# Patient Record
Sex: Male | Born: 1970 | Race: Black or African American | Hispanic: No | Marital: Single | Smoking: Current every day smoker
Health system: Southern US, Community
[De-identification: ages and names within clinical notes are randomized; demographics above are authoritative.]

## PROBLEM LIST (undated history)

## (undated) DIAGNOSIS — K5090 Crohn's disease, unspecified, without complications: Secondary | ICD-10-CM

## (undated) DIAGNOSIS — M1990 Unspecified osteoarthritis, unspecified site: Secondary | ICD-10-CM

## (undated) DIAGNOSIS — E785 Hyperlipidemia, unspecified: Secondary | ICD-10-CM

---

## 2012-05-09 ENCOUNTER — Emergency Department (HOSPITAL_BASED_OUTPATIENT_CLINIC_OR_DEPARTMENT_OTHER)
Admission: EM | Admit: 2012-05-09 | Discharge: 2012-05-09 | Disposition: A | Discharge status: Home / Self Care | Payer: Self-pay | Attending: Emergency Medicine | Admitting: Emergency Medicine

## 2012-05-09 ENCOUNTER — Encounter (HOSPITAL_BASED_OUTPATIENT_CLINIC_OR_DEPARTMENT_OTHER): Payer: Self-pay | Admitting: *Deleted

## 2012-05-09 DIAGNOSIS — F17200 Nicotine dependence, unspecified, uncomplicated: Secondary | ICD-10-CM | POA: Insufficient documentation

## 2012-05-09 DIAGNOSIS — IMO0002 Reserved for concepts with insufficient information to code with codable children: Secondary | ICD-10-CM | POA: Insufficient documentation

## 2012-05-09 DIAGNOSIS — M129 Arthropathy, unspecified: Secondary | ICD-10-CM | POA: Insufficient documentation

## 2012-05-09 DIAGNOSIS — Y998 Other external cause status: Secondary | ICD-10-CM | POA: Insufficient documentation

## 2012-05-09 DIAGNOSIS — K5090 Crohn's disease, unspecified, without complications: Secondary | ICD-10-CM | POA: Insufficient documentation

## 2012-05-09 DIAGNOSIS — S46919A Strain of unspecified muscle, fascia and tendon at shoulder and upper arm level, unspecified arm, initial encounter: Secondary | ICD-10-CM

## 2012-05-09 DIAGNOSIS — S139XXA Sprain of joints and ligaments of unspecified parts of neck, initial encounter: Secondary | ICD-10-CM | POA: Insufficient documentation

## 2012-05-09 DIAGNOSIS — S161XXA Strain of muscle, fascia and tendon at neck level, initial encounter: Secondary | ICD-10-CM

## 2012-05-09 DIAGNOSIS — M5430 Sciatica, unspecified side: Secondary | ICD-10-CM | POA: Insufficient documentation

## 2012-05-09 DIAGNOSIS — Y93I9 Activity, other involving external motion: Secondary | ICD-10-CM | POA: Insufficient documentation

## 2012-05-09 HISTORY — DX: Crohn's disease, unspecified, without complications: K50.90

## 2012-05-09 HISTORY — DX: Unspecified osteoarthritis, unspecified site: M19.90

## 2012-05-09 MED ORDER — CYCLOBENZAPRINE HCL 10 MG PO TABS: 10.0000 mg | ORAL_TABLET | Freq: Three times a day (TID) | ORAL | Status: AC | PRN

## 2012-05-09 MED ORDER — NAPROXEN 500 MG PO TABS: 500.0000 mg | ORAL_TABLET | Freq: Two times a day (BID) | ORAL | Status: AC

## 2012-05-09 MED ORDER — IBUPROFEN 800 MG PO TABS
800.0000 mg | ORAL_TABLET | Freq: Once | ORAL | Status: AC
  Administered 2012-05-09: 800 mg via ORAL
  Filled 2012-05-09: qty 1

## 2012-05-09 MED ORDER — HYDROCODONE-ACETAMINOPHEN 5-500 MG PO TABS: 1.0000 | ORAL_TABLET | Freq: Four times a day (QID) | ORAL | Status: AC | PRN

## 2012-05-09 NOTE — Discharge Instructions (Signed)
Cervical Strain Care After A cervical strain is when the muscles and ligaments in your neck have been stretched. The bones are not broken. If you had any problems moving your arms or legs immediately after the injury, even if the problem has gone away, make sure to tell this to your caregiver.  HOME CARE INSTRUCTIONS   While awake, apply ice packs to the neck or areas of pain about every 1 to 2 hours, for 15 to 20 minutes at a time. Do this for 2 days. If you were given a cervical collar for support, ask your caregiver if you may remove it for bathing or applying ice.   If given a cervical collar, wear as instructed. Do not remove any collar unless instructed by a caregiver.   Only take over-the-counter or prescription medicines for pain, discomfort, or fever as directed by your caregiver.  Recheck with the hospital or clinic after a radiologist has read your X-rays. Recheck with the hospital or clinic to make sure the initial readings are correct. Do this also to determine if you need further studies. It is your responsibility to find out your X-ray results. X-rays are sometimes repeated in one week to ten days. These are often repeated to make sure that a hairline fracture was not overlooked. Ask your caregiver how you are to find out about your radiology (X-ray) results. SEEK IMMEDIATE MEDICAL CARE IF:   You have increasing pain in your neck.   You develop difficulties swallowing or breathing.   You have numbness, weakness, or movement problems in the arms or legs.   You have difficulty walking.   You develop bowel or bladder retention or incontinence.   You have problems with walking.  MAKE SURE YOU:   Understand these instructions.   Will watch your condition.   Will get help right away if you are not doing well or get worse.  Document Released: 11/20/2005 Document Revised: 08/02/2011 Document Reviewed: 07/03/2008 [facility] Patient Information 2012 [place],  [place].  Sciatica Sciatica is a weakness and/or changes in sensation (tingling, jolts, hot and cold, numbness) along the path the sciatic nerve travels. Irritation or damage to lumbar nerve roots is often also referred to as lumbar radiculopathy.  Lumbar radiculopathy (Sciatica) is the most common form of this problem. Radiculopathy can occur in any of the nerves coming out of the spinal cord. The problems caused depend on which nerves are involved. The sciatic nerve is the large nerve supplying the branches of nerves going from the hip to the toes. It often causes a numbness or weakness in the skin and/or muscles that the sciatic nerve serves. It also may cause symptoms (problems) of pain, burning, tingling, or electric shock-like feelings in the path of this nerve. This usually comes from injury to the fibers that make up the sciatic nerve. Some of these symptoms are low back pain and/or unpleasant feelings in the following areas:  From the mid-buttock down the back of the leg to the back of the knee.   And/or the outside of the calf and top of the foot.   And/or behind the inner ankle to the sole of the foot.  CAUSES   Herniated or slipped disc. Discs are the little cushions between the bones in the back.   Pressure by the piriformis muscle in the buttock on the sciatic nerve (Piriformis Syndrome).   Misalignment of the bones in the lower back and buttocks (Sacroiliac Joint Derangement).   Narrowing of the spinal canal  that puts pressure on or pinches the fibers that make up the sciatic nerve.   A slipped vertebra that is out of line with those above or beneath it.   Abnormality of the nervous system itself so that nerve fibers do not transmit signals properly, especially to feet and calves (neuropathy).   Tumor (this is rare).  Your caregiver can usually determine the cause of your sciatica and begin the treatment most likely to help you. TREATMENT  Taking over-the-counter painkillers,  physical therapy, rest, exercise, spinal manipulation, and injections of anesthetics and/or steroids may be used. Surgery, acupuncture, and Yoga can also be effective. Mind over matter techniques, mental imagery, and changing factors such as your bed, chair, desk height, posture, and activities are other treatments that may be helpful. You and your caregiver can help determine what is best for you. With proper diagnosis, the cause of most sciatica can be identified and removed. Communication and cooperation between your caregiver and you is essential. If you are not successful immediately, do not be discouraged. With time, a proper treatment can be found that will make you comfortable. HOME CARE INSTRUCTIONS   If the pain is coming from a problem in the back, applying ice to that area for 15 to 20 minutes, 3 to 4 times per day while awake, may be helpful. Put the ice in a plastic bag. Place a towel between the bag of ice and your skin.   You may exercise or perform your usual activities if these do not aggravate your pain, or as suggested by your caregiver.   Only take over-the-counter or prescription medicines for pain, discomfort, or fever as directed by your caregiver.   If your caregiver has given you a follow-up appointment, it is very important to keep that appointment. Not keeping the appointment could result in a chronic or permanent injury, pain, and disability. If there is any problem keeping the appointment, you must call back to this facility for assistance.  SEEK IMMEDIATE MEDICAL CARE IF:   You experience loss of control of bowel or bladder.   You have increasing weakness in the trunk, buttocks, or legs.   There is numbness in any areas from the hip down to the toes.   You have difficulty walking or keeping your balance.   You have any of the above, with fever or forceful vomiting.  Document Released: 11/14/2001 Document Revised: 11/09/2011 Document Reviewed: 07/03/2008 [facility]  Patient Information 2012 [place], [place].
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
## 2012-12-04 HISTORY — PX: FOOT SURGERY: SHX648

## 2015-05-02 ENCOUNTER — Encounter (HOSPITAL_COMMUNITY): Payer: Self-pay | Admitting: Emergency Medicine

## 2015-05-02 ENCOUNTER — Emergency Department (HOSPITAL_COMMUNITY): Payer: Managed Care, Other (non HMO)

## 2015-05-02 ENCOUNTER — Emergency Department (HOSPITAL_COMMUNITY)
Admission: EM | Admit: 2015-05-02 | Discharge: 2015-05-02 | Disposition: A | Discharge status: Home / Self Care | Payer: Managed Care, Other (non HMO) | Attending: Emergency Medicine | Admitting: Emergency Medicine

## 2015-05-02 DIAGNOSIS — M1990 Unspecified osteoarthritis, unspecified site: Secondary | ICD-10-CM | POA: Diagnosis not present

## 2015-05-02 DIAGNOSIS — R55 Syncope and collapse: Secondary | ICD-10-CM | POA: Diagnosis present

## 2015-05-02 DIAGNOSIS — Y92410 Unspecified street and highway as the place of occurrence of the external cause: Secondary | ICD-10-CM | POA: Insufficient documentation

## 2015-05-02 DIAGNOSIS — Y939 Activity, unspecified: Secondary | ICD-10-CM | POA: Diagnosis not present

## 2015-05-02 DIAGNOSIS — X58XXXA Exposure to other specified factors, initial encounter: Secondary | ICD-10-CM | POA: Insufficient documentation

## 2015-05-02 DIAGNOSIS — Y999 Unspecified external cause status: Secondary | ICD-10-CM | POA: Diagnosis not present

## 2015-05-02 DIAGNOSIS — S0993XA Unspecified injury of face, initial encounter: Secondary | ICD-10-CM | POA: Diagnosis not present

## 2015-05-02 DIAGNOSIS — Z720 Tobacco use: Secondary | ICD-10-CM | POA: Diagnosis not present

## 2015-05-02 DIAGNOSIS — R110 Nausea: Secondary | ICD-10-CM | POA: Insufficient documentation

## 2015-05-02 DIAGNOSIS — S0990XA Unspecified injury of head, initial encounter: Secondary | ICD-10-CM | POA: Insufficient documentation

## 2015-05-02 DIAGNOSIS — S0240CB Maxillary fracture, right side, initial encounter for open fracture: Secondary | ICD-10-CM

## 2015-05-02 DIAGNOSIS — S0181XA Laceration without foreign body of other part of head, initial encounter: Secondary | ICD-10-CM | POA: Insufficient documentation

## 2015-05-02 DIAGNOSIS — S02401B Maxillary fracture, unspecified, initial encounter for open fracture: Secondary | ICD-10-CM | POA: Diagnosis not present

## 2015-05-02 DIAGNOSIS — Z23 Encounter for immunization: Secondary | ICD-10-CM | POA: Diagnosis not present

## 2015-05-02 DIAGNOSIS — Z8719 Personal history of other diseases of the digestive system: Secondary | ICD-10-CM | POA: Diagnosis not present

## 2015-05-02 LAB — CBC WITH DIFFERENTIAL/PLATELET
Basophils Absolute: 0 10*3/uL (ref 0.0–0.1)
Basophils Relative: 0 % (ref 0–1)
Eosinophils Absolute: 0.1 10*3/uL (ref 0.0–0.7)
Eosinophils Relative: 1 % (ref 0–5)
HEMATOCRIT: 42.7 % (ref 39.0–52.0)
HEMOGLOBIN: 14.6 g/dL (ref 13.0–17.0)
LYMPHS ABS: 2.8 10*3/uL (ref 0.7–4.0)
Lymphocytes Relative: 51 % — ABNORMAL HIGH (ref 12–46)
MCH: 29.7 pg (ref 26.0–34.0)
MCHC: 34.2 g/dL (ref 30.0–36.0)
MCV: 86.8 fL (ref 78.0–100.0)
Monocytes Absolute: 0.3 10*3/uL (ref 0.1–1.0)
Monocytes Relative: 6 % (ref 3–12)
Neutro Abs: 2.4 10*3/uL (ref 1.7–7.7)
Neutrophils Relative %: 42 % — ABNORMAL LOW (ref 43–77)
PLATELETS: 154 10*3/uL (ref 150–400)
RBC: 4.92 MIL/uL (ref 4.22–5.81)
RDW: 13.2 % (ref 11.5–15.5)
WBC: 5.6 10*3/uL (ref 4.0–10.5)

## 2015-05-02 LAB — COMPREHENSIVE METABOLIC PANEL
ALBUMIN: 4.1 g/dL (ref 3.5–5.0)
ALT: 43 U/L (ref 17–63)
ANION GAP: 9 (ref 5–15)
AST: 58 U/L — AB (ref 15–41)
Alkaline Phosphatase: 74 U/L (ref 38–126)
BILIRUBIN TOTAL: 1.4 mg/dL — AB (ref 0.3–1.2)
BUN: 12 mg/dL (ref 6–20)
CHLORIDE: 103 mmol/L (ref 101–111)
CO2: 24 mmol/L (ref 22–32)
CREATININE: 1.17 mg/dL (ref 0.61–1.24)
Calcium: 8.7 mg/dL — ABNORMAL LOW (ref 8.9–10.3)
Glucose, Bld: 159 mg/dL — ABNORMAL HIGH (ref 65–99)
POTASSIUM: 5.4 mmol/L — AB (ref 3.5–5.1)
SODIUM: 136 mmol/L (ref 135–145)
TOTAL PROTEIN: 6.7 g/dL (ref 6.5–8.1)

## 2015-05-02 LAB — CBG MONITORING, ED: Glucose-Capillary: 162 mg/dL — ABNORMAL HIGH (ref 65–99)

## 2015-05-02 MED ORDER — CLINDAMYCIN HCL 300 MG PO CAPS
300.0000 mg | ORAL_CAPSULE | Freq: Once | ORAL | Status: AC
  Administered 2015-05-02: 300 mg via ORAL
  Filled 2015-05-02: qty 1

## 2015-05-02 MED ORDER — FENTANYL CITRATE (PF) 100 MCG/2ML IJ SOLN
50.0000 ug | Freq: Once | INTRAMUSCULAR | Status: AC
  Administered 2015-05-02: 50 ug via INTRAVENOUS
  Filled 2015-05-02: qty 2

## 2015-05-02 MED ORDER — FENTANYL CITRATE (PF) 100 MCG/2ML IJ SOLN
INTRAMUSCULAR | Status: AC
  Filled 2015-05-02: qty 2

## 2015-05-02 MED ORDER — OXYCODONE-ACETAMINOPHEN 5-325 MG PO TABS: 1.0000 | ORAL_TABLET | ORAL | Status: AC | PRN

## 2015-05-02 MED ORDER — FENTANYL CITRATE (PF) 100 MCG/2ML IJ SOLN
50.0000 ug | Freq: Once | INTRAMUSCULAR | Status: AC
  Administered 2015-05-02: 50 ug via INTRAVENOUS

## 2015-05-02 MED ORDER — TETANUS-DIPHTH-ACELL PERTUSSIS 5-2.5-18.5 LF-MCG/0.5 IM SUSP
0.5000 mL | Freq: Once | INTRAMUSCULAR | Status: AC
  Administered 2015-05-02: 0.5 mL via INTRAMUSCULAR
  Filled 2015-05-02: qty 0.5

## 2015-05-02 MED ORDER — CLINDAMYCIN HCL 300 MG PO CAPS: 300.0000 mg | ORAL_CAPSULE | Freq: Four times a day (QID) | ORAL | Status: AC

## 2015-05-02 MED ORDER — LIDOCAINE-EPINEPHRINE (PF) 2 %-1:200000 IJ SOLN
10.0000 mL | Freq: Once | INTRAMUSCULAR | Status: AC
  Administered 2015-05-02: 10 mL
  Filled 2015-05-02: qty 20

## 2015-05-02 NOTE — ED Notes (Signed)
Patient transported to CT 

## 2015-05-02 NOTE — ED Provider Notes (Signed)
  LACERATION REPAIR Performed by: Evynn Boutelle Authorized by: Oswaldo ConroyREECH, Labaron Digirolamo Consent: Verbal consent obtained. Risks and benefits: risks, benefits and alternatives were discussed Consent given by: patient Patient identity confirmed: provided demographic data Prepped and Draped in normal sterile fashion Wound explored  Laceration Location: right lower chin  Laceration Length: 2-3cm  No Foreign Bodies seen or palpated  Anesthesia: local infiltration  Local anesthetic: lidocaine 2% with epinephrine  Anesthetic total: 5 ml  Irrigation method: syringe Amount of cleaning: extensive  Skin closure: 5-0 Vicryl Rapid for mucosal closure. 5-0 Proline for cutaneous closure  Number of sutures: 2 vicryl 4 proline  Technique: simple interrupted  Patient tolerance: Patient tolerated the procedure well with no immediate complications.   Oswaldo ConroyVictoria Gauri Galvao, PA-C 05/02/15 [phone]  Loren Raceravid Yelverton, MD 05/03/15 [phone]
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
## 2015-05-02 NOTE — ED Notes (Signed)
Tori, PA at the bedside.  

## 2015-05-02 NOTE — Discharge Instructions (Signed)
Your sutures need to be removed in 7 days. Call and make an appointment to follow-up with the oral surgeon regarding your dental and facial fracture. Take antibiotics as prescribed. Return immediately for any evidence of infection including redness, swelling, warmth or fever.  Dental Fracture You have a dental fracture or injury. This can mean the tooth is loose, has a chip in the enamel or is broken. If just the outer enamel is chipped, there is a good chance the tooth will not become infected. The only treatment needed may be to smooth off a rough edge. Fractures into the deeper layers (dentin and pulp) cause greater pain and are more likely to become infected. These require you to see a dentist as soon as possible to save the tooth. Loose teeth may need to be wired or bonded with a plastic splint to hold them in place. A paste may be painted on the open area of the broken tooth to reduce the pain. Antibiotics and pain medicine may be prescribed. Choosing a soft or liquid diet and rinsing the mouth out with warm water after meals may be helpful. See your dentist as recommended. Failure to seek care or follow up with a dentist or other specialist as recommended could result in the loss of your tooth, infection, or permanent dental problems. SEEK MEDICAL CARE IF:   You have increased pain not controlled with medicines.  You have swelling around the tooth, in the face or neck.  You have bleeding which starts, continues, or gets worse.  You have a fever. Document Released: 12/28/2004 Document Revised: 02/12/2012 Document Reviewed: 10/12/2009 [facility] Patient Information 2015 [place], [place]. This information is not intended to replace advice given to you by your health care provider. Make sure you discuss any questions you have with your health care provider.  Facial Fracture A facial fracture is a break in one of the bones of your face. HOME CARE INSTRUCTIONS   Protect the injured part of your  face until it is healed.  Do not participate in activities which give chance for re-injury until your doctor approves.  Gently wash and dry your face.  Wear head and facial protection while riding a bicycle, motorcycle, or snowmobile. SEEK MEDICAL CARE IF:   An oral temperature above 102 F (38.9 C) develops.  You have severe headaches or notice changes in your vision.  You have new numbness or tingling in your face.  You develop nausea (feeling sick to your stomach), vomiting or a stiff neck. SEEK IMMEDIATE MEDICAL CARE IF:   You develop difficulty seeing or experience double vision.  You become dizzy, lightheaded, or faint.  You develop trouble speaking, breathing, or swallowing.  You have a watery discharge from your nose or ear. MAKE SURE YOU:   Understand these instructions.  Will watch your condition.  Will get help right away if you are not doing well or get worse. Document Released: 11/20/2005 Document Revised: 02/12/2012 Document Reviewed: 07/09/2008 [facility] Patient Information 2015 [place], [place]. This information is not intended to replace advice given to you by your health care provider. Make sure you discuss any questions you have with your health care provider.  Head Injury You have received a head injury. It does not appear serious at this time. Headaches and vomiting are common following head injury. It should be easy to awaken from sleeping. Sometimes it is necessary for you to stay in the emergency department for a while for observation. Sometimes admission to the hospital may be  needed. After injuries such as yours, most problems occur within the first 24 hours, but side effects may occur up to 7-10 days after the injury. It is important for you to carefully monitor your condition and contact your health care provider or seek immediate medical care if there is a change in your condition. WHAT ARE THE TYPES OF HEAD INJURIES? Head injuries can be as minor as  a bump. Some head injuries can be more severe. More severe head injuries include:  A jarring injury to the brain (concussion).  A bruise of the brain (contusion). This mean there is bleeding in the brain that can cause swelling.  A cracked skull (skull fracture).  Bleeding in the brain that collects, clots, and forms a bump (hematoma). WHAT CAUSES A HEAD INJURY? A serious head injury is most likely to happen to someone who is in a car wreck and is not wearing a seat belt. Other causes of major head injuries include bicycle or motorcycle accidents, sports injuries, and falls. HOW ARE HEAD INJURIES DIAGNOSED? A complete history of the event leading to the injury and your current symptoms will be helpful in diagnosing head injuries. Many times, pictures of the brain, such as CT or MRI are needed to see the extent of the injury. Often, an overnight hospital stay is necessary for observation.  WHEN SHOULD I SEEK IMMEDIATE MEDICAL CARE?  You should get help right away if:  You have confusion or drowsiness.  You feel sick to your stomach (nauseous) or have continued, forceful vomiting.  You have dizziness or unsteadiness that is getting worse.  You have severe, continued headaches not relieved by medicine. Only take over-the-counter or prescription medicines for pain, fever, or discomfort as directed by your health care provider.  You do not have normal function of the arms or legs or are unable to walk.  You notice changes in the black spots in the center of the colored part of your eye (pupil).  You have a clear or bloody fluid coming from your nose or ears.  You have a loss of vision. During the next 24 hours after the injury, you must stay with someone who can watch you for the warning signs. This person should contact local emergency services (911 in the [place] if you have seizures, you become unconscious, or you are unable to wake up. HOW CAN I PREVENT A HEAD INJURY IN THE FUTURE? The  most important factor for preventing major head injuries is avoiding motor vehicle accidents. To minimize the potential for damage to your head, it is crucial to wear seat belts while riding in motor vehicles. Wearing helmets while bike riding and playing collision sports (like football) is also helpful. Also, avoiding dangerous activities around the house will further help reduce your risk of head injury.  WHEN CAN I RETURN TO NORMAL ACTIVITIES AND ATHLETICS? You should be reevaluated by your health care provider before returning to these activities. If you have any of the following symptoms, you should not return to activities or contact sports until 1 week after the symptoms have stopped:  Persistent headache.  Dizziness or vertigo.  Poor attention and concentration.  Confusion.  Memory problems.  Nausea or vomiting.  Fatigue or tire easily.  Irritability.  Intolerant of bright lights or loud noises.  Anxiety or depression.  Disturbed sleep. MAKE SURE YOU:   Understand these instructions.  Will watch your condition.  Will get help right away if you are not doing well or get  worse. Document Released: 11/20/2005 Document Revised: 11/25/2013 Document Reviewed: 07/28/2013 [facility] Patient Information 2015 [place], [place]. This information is not intended to replace advice given to you by your health care provider. Make sure you discuss any questions you have with your health care provider.  Laceration Care, Adult A laceration is a cut or lesion that goes through all layers of the skin and into the tissue just beneath the skin. TREATMENT  Some lacerations may not require closure. Some lacerations may not be able to be closed due to an increased risk of infection. It is important to see your caregiver as soon as possible after an injury to minimize the risk of infection and maximize the opportunity for successful closure. If closure is appropriate, pain medicines may be given, if  needed. The wound will be cleaned to help prevent infection. Your caregiver will use stitches (sutures), staples, wound glue (adhesive), or skin adhesive strips to repair the laceration. These tools bring the skin edges together to allow for faster healing and a better cosmetic outcome. However, all wounds will heal with a scar. Once the wound has healed, scarring can be minimized by covering the wound with sunscreen during the day for 1 full year. HOME CARE INSTRUCTIONS  For sutures or staples:  Keep the wound clean and dry.  If you were given a bandage (dressing), you should change it at least once a day. Also, change the dressing if it becomes wet or dirty, or as directed by your caregiver.  Wash the wound with soap and water 2 times a day. Rinse the wound off with water to remove all soap. Pat the wound dry with a clean towel.  After cleaning, apply a thin layer of the antibiotic ointment as recommended by your caregiver. This will help prevent infection and keep the dressing from sticking.  You may shower as usual after the first 24 hours. Do not soak the wound in water until the sutures are removed.  Only take over-the-counter or prescription medicines for pain, discomfort, or fever as directed by your caregiver.  Get your sutures or staples removed as directed by your caregiver. For skin adhesive strips:  Keep the wound clean and dry.  Do not get the skin adhesive strips wet. You may bathe carefully, using caution to keep the wound dry.  If the wound gets wet, pat it dry with a clean towel.  Skin adhesive strips will fall off on their own. You may trim the strips as the wound heals. Do not remove skin adhesive strips that are still stuck to the wound. They will fall off in time. For wound adhesive:  You may briefly wet your wound in the shower or bath. Do not soak or scrub the wound. Do not swim. Avoid periods of heavy perspiration until the skin adhesive has fallen off on its own.  After showering or bathing, gently pat the wound dry with a clean towel.  Do not apply liquid medicine, cream medicine, or ointment medicine to your wound while the skin adhesive is in place. This may loosen the film before your wound is healed.  If a dressing is placed over the wound, be careful not to apply tape directly over the skin adhesive. This may cause the adhesive to be pulled off before the wound is healed.  Avoid prolonged exposure to sunlight or tanning lamps while the skin adhesive is in place. Exposure to ultraviolet light in the first year will darken the scar.  The skin adhesive will  usually remain in place for 5 to 10 days, then naturally fall off the skin. Do not pick at the adhesive film. You may need a tetanus shot if:  You cannot remember when you had your last tetanus shot.  You have never had a tetanus shot. If you get a tetanus shot, your arm may swell, get red, and feel warm to the touch. This is common and not a problem. If you need a tetanus shot and you choose not to have one, there is a rare chance of getting tetanus. Sickness from tetanus can be serious. SEEK MEDICAL CARE IF:   You have redness, swelling, or increasing pain in the wound.  You see a red line that goes away from the wound.  You have yellowish-white fluid (pus) coming from the wound.  You have a fever.  You notice a bad smell coming from the wound or dressing.  Your wound breaks open before or after sutures have been removed.  You notice something coming out of the wound such as wood or glass.  Your wound is on your hand or foot and you cannot move a finger or toe. SEEK IMMEDIATE MEDICAL CARE IF:   Your pain is not controlled with prescribed medicine.  You have severe swelling around the wound causing pain and numbness or a change in color in your arm, hand, leg, or foot.  Your wound splits open and starts bleeding.  You have worsening numbness, weakness, or loss of function of any  joint around or beyond the wound.  You develop painful lumps near the wound or on the skin anywhere on your body. MAKE SURE YOU:   Understand these instructions.  Will watch your condition.  Will get help right away if you are not doing well or get worse. Document Released: 11/20/2005 Document Revised: 02/12/2012 Document Reviewed: 05/16/2011 [facility] Patient Information 2015 [place], [place]. This information is not intended to replace advice given to you by your health care provider. Make sure you discuss any questions you have with your health care provider.  Syncope Syncope is a medical term for fainting or passing out. This means you lose consciousness and drop to the ground. People are generally unconscious for less than 5 minutes. You may have some muscle twitches for up to 15 seconds before waking up and returning to normal. Syncope occurs more often in older adults, but it can happen to anyone. While most causes of syncope are not dangerous, syncope can be a sign of a serious medical problem. It is important to seek medical care.  CAUSES  Syncope is caused by a sudden drop in blood flow to the brain. The specific cause is often not determined. Factors that can bring on syncope include:  Taking medicines that lower blood pressure.  Sudden changes in posture, such as standing up quickly.  Taking more medicine than prescribed.  Standing in one place for too long.  Seizure disorders.  Dehydration and excessive exposure to heat.  Low blood sugar (hypoglycemia).  Straining to have a bowel movement.  Heart disease, irregular heartbeat, or other circulatory problems.  Fear, emotional distress, seeing blood, or severe pain. SYMPTOMS  Right before fainting, you may:  Feel dizzy or light-headed.  Feel nauseous.  See all white or all black in your field of vision.  Have cold, clammy skin. DIAGNOSIS  Your health care provider will ask about your symptoms, perform a physical  exam, and perform an electrocardiogram (ECG) to record the electrical activity of your  heart. Your health care provider may also perform other heart or blood tests to determine the cause of your syncope which may include:  Transthoracic echocardiogram (TTE). During echocardiography, sound waves are used to evaluate how blood flows through your heart.  Transesophageal echocardiogram (TEE).  Cardiac monitoring. This allows your health care provider to monitor your heart rate and rhythm in real time.  Holter monitor. This is a portable device that records your heartbeat and can help diagnose heart arrhythmias. It allows your health care provider to track your heart activity for several days, if needed.  Stress tests by exercise or by giving medicine that makes the heart beat faster. TREATMENT  In most cases, no treatment is needed. Depending on the cause of your syncope, your health care provider may recommend changing or stopping some of your medicines. HOME CARE INSTRUCTIONS  Have someone stay with you until you feel stable.  Do not drive, use machinery, or play sports until your health care provider says it is okay.  Keep all follow-up appointments as directed by your health care provider.  Lie down right away if you start feeling like you might faint. Breathe deeply and steadily. Wait until all the symptoms have passed.  Drink enough fluids to keep your urine clear or pale yellow.  If you are taking blood pressure or heart medicine, get up slowly and take several minutes to sit and then stand. This can reduce dizziness. SEEK IMMEDIATE MEDICAL CARE IF:   You have a severe headache.  You have unusual pain in the chest, abdomen, or back.  You are bleeding from your mouth or rectum, or you have black or tarry stool.  You have an irregular or very fast heartbeat.  You have pain with breathing.  You have repeated fainting or seizure-like jerking during an episode.  You faint when  sitting or lying down.  You have confusion.  You have trouble walking.  You have severe weakness.  You have vision problems. If you fainted, call your local emergency services (911 in [place] Do not drive yourself to the hospital.  MAKE SURE YOU:  Understand these instructions.  Will watch your condition.  Will get help right away if you are not doing well or get worse. Document Released: 11/20/2005 Document Revised: 11/25/2013 Document Reviewed: 01/19/2012 [facility] Patient Information 2015 [place]. This information is not intended to replace advice given to you by your health care provider. Make sure you discuss any questions you have with your health care provider.
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
## 2015-05-11 ENCOUNTER — Emergency Department (HOSPITAL_BASED_OUTPATIENT_CLINIC_OR_DEPARTMENT_OTHER)
Admission: EM | Admit: 2015-05-11 | Discharge: 2015-05-11 | Disposition: A | Discharge status: Home / Self Care | Payer: Managed Care, Other (non HMO) | Attending: Emergency Medicine | Admitting: Emergency Medicine

## 2015-05-11 ENCOUNTER — Encounter (HOSPITAL_BASED_OUTPATIENT_CLINIC_OR_DEPARTMENT_OTHER): Payer: Self-pay | Admitting: Emergency Medicine

## 2015-05-11 DIAGNOSIS — Z792 Long term (current) use of antibiotics: Secondary | ICD-10-CM | POA: Diagnosis not present

## 2015-05-11 DIAGNOSIS — Z8719 Personal history of other diseases of the digestive system: Secondary | ICD-10-CM | POA: Insufficient documentation

## 2015-05-11 DIAGNOSIS — Z8739 Personal history of other diseases of the musculoskeletal system and connective tissue: Secondary | ICD-10-CM | POA: Diagnosis not present

## 2015-05-11 DIAGNOSIS — Z720 Tobacco use: Secondary | ICD-10-CM | POA: Diagnosis not present

## 2015-05-11 DIAGNOSIS — Z4802 Encounter for removal of sutures: Secondary | ICD-10-CM

## 2015-05-11 NOTE — ED Provider Notes (Signed)
CSN: [id number]     Arrival date & time 05/11/15  0128 History   First MD Initiated Contact with Patient 05/11/15 0145     Chief Complaint  Patient presents with  . Suture / Staple Removal     (Consider location/radiation/quality/duration/timing/severity/associated sxs/prior Treatment) Patient is a 44 y.o. male presenting with suture removal. The history is provided by the patient.  Suture / Staple Removal This is a new problem. The current episode started more than 1 week ago. The problem occurs constantly. The problem has not changed since onset.Pertinent negatives include no chest pain, no abdominal pain, no headaches and no shortness of breath. Nothing aggravates the symptoms. He has tried nothing for the symptoms. The treatment provided significant relief.  Stitched placed in chin no drainage.  Healing well.  No f/c/r  Past Medical History  Diagnosis Date  . Arthritis   . Crohn's disease    Past Surgical History  Procedure Laterality Date  . Foot surgery Bilateral 2014   History reviewed. No pertinent family history. History  Substance Use Topics  . Smoking status: Current Every Day Smoker -- 0.50 packs/day  . Smokeless tobacco: Not on file  . Alcohol Use: No     Comment: one drink last ngith    Review of Systems  Respiratory: Negative for shortness of breath.   Cardiovascular: Negative for chest pain.  Gastrointestinal: Negative for abdominal pain.  Neurological: Negative for headaches.  All other systems reviewed and are negative.     Allergies  Aspirin  Home Medications   Prior to Admission medications   Medication Sig Start Date End Date Taking? Authorizing Provider  clindamycin (CLEOCIN) 300 MG capsule Take 1 capsule (300 mg total) by mouth 4 (four) times daily. X 7 days 05/02/15   Loren Raceravid Yelverton, MD  naproxen sodium (ANAPROX) 220 MG tablet Take 220 mg by mouth daily as needed (pain). For pain    Historical Provider, MD  oxyCODONE-acetaminophen (PERCOCET)  5-325 MG per tablet Take 1-2 tablets by mouth every 4 (four) hours as needed for severe pain. 05/02/15   Loren Raceravid Yelverton, MD   BP 128/73 mmHg  Pulse 97  Temp(Src) 98.4 F (36.9 C) (Oral)  Resp 16  Ht 6\' 4"  (1.93 m)  Wt 232 lb (105.235 kg)  BMI 28.25 kg/m2  SpO2 96% Physical Exam  Constitutional: He is oriented to person, place, and time. He appears well-developed and well-nourished. No distress.  HENT:  Head: Normocephalic.    Mouth/Throat: Oropharynx is clear and moist.  Eyes: Conjunctivae are normal. Pupils are equal, round, and reactive to light.  Neck: Normal range of motion. Neck supple.  Cardiovascular: Normal rate, regular rhythm and normal heart sounds.   Pulmonary/Chest: Effort normal and breath sounds normal. He has no wheezes. He has no rales.  Abdominal: Soft. Bowel sounds are normal. There is no tenderness.  Musculoskeletal: Normal range of motion.  Neurological: He is alert and oriented to person, place, and time.  Skin: Skin is warm and dry.  Psychiatric: He has a normal mood and affect.    ED Course  Procedures (including critical care time) Labs Review Labs Reviewed - No data to display  Imaging Review No results found.   EKG Interpretation None      MDM   Final diagnoses:  Visit for suture removal    Aftercare instructions given.  Hat in sun x6 months and spf 50   Raunak Antuna, MD 05/11/15 [id number]

## 2015-05-11 NOTE — ED Notes (Signed)
Patient has 3 stiches to his lower lip

## 2015-05-11 NOTE — ED Notes (Signed)
Patient had 4 stiches to his chin, stiches removed.

## 2015-05-11 NOTE — Discharge Instructions (Signed)

## 2018-09-24 ENCOUNTER — Encounter (HOSPITAL_BASED_OUTPATIENT_CLINIC_OR_DEPARTMENT_OTHER): Payer: Self-pay | Admitting: *Deleted

## 2018-09-24 ENCOUNTER — Other Ambulatory Visit: Payer: Self-pay

## 2018-09-24 ENCOUNTER — Emergency Department (HOSPITAL_BASED_OUTPATIENT_CLINIC_OR_DEPARTMENT_OTHER)
Admission: EM | Admit: 2018-09-24 | Discharge: 2018-09-24 | Disposition: A | Discharge status: Home / Self Care | Payer: 59 | Attending: Emergency Medicine | Admitting: Emergency Medicine

## 2018-09-24 ENCOUNTER — Emergency Department (HOSPITAL_BASED_OUTPATIENT_CLINIC_OR_DEPARTMENT_OTHER): Payer: 59

## 2018-09-24 DIAGNOSIS — Z79899 Other long term (current) drug therapy: Secondary | ICD-10-CM | POA: Insufficient documentation

## 2018-09-24 DIAGNOSIS — Y9389 Activity, other specified: Secondary | ICD-10-CM | POA: Insufficient documentation

## 2018-09-24 DIAGNOSIS — R55 Syncope and collapse: Secondary | ICD-10-CM | POA: Insufficient documentation

## 2018-09-24 DIAGNOSIS — Y999 Unspecified external cause status: Secondary | ICD-10-CM | POA: Insufficient documentation

## 2018-09-24 DIAGNOSIS — Y92410 Unspecified street and highway as the place of occurrence of the external cause: Secondary | ICD-10-CM | POA: Insufficient documentation

## 2018-09-24 DIAGNOSIS — M542 Cervicalgia: Secondary | ICD-10-CM | POA: Diagnosis present

## 2018-09-24 MED ORDER — ACETAMINOPHEN 500 MG PO TABS
1000.0000 mg | ORAL_TABLET | Freq: Once | ORAL | Status: AC
  Administered 2018-09-24: 1000 mg via ORAL
  Filled 2018-09-24: qty 2

## 2018-09-24 NOTE — ED Notes (Signed)
Went in to discharge pt  Pt not in room
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
## 2018-09-24 NOTE — ED Notes (Addendum)
Documentation on wrong pt. corrected
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
## 2022-04-25 ENCOUNTER — Emergency Department (HOSPITAL_BASED_OUTPATIENT_CLINIC_OR_DEPARTMENT_OTHER)
Admission: EM | Admit: 2022-04-25 | Discharge: 2022-04-25 | Disposition: A | Discharge status: Home / Self Care | Payer: 59 | Attending: Emergency Medicine | Admitting: Emergency Medicine

## 2022-04-25 ENCOUNTER — Other Ambulatory Visit: Payer: Self-pay

## 2022-04-25 ENCOUNTER — Encounter (HOSPITAL_BASED_OUTPATIENT_CLINIC_OR_DEPARTMENT_OTHER): Payer: Self-pay | Admitting: Emergency Medicine

## 2022-04-25 ENCOUNTER — Emergency Department (HOSPITAL_BASED_OUTPATIENT_CLINIC_OR_DEPARTMENT_OTHER): Payer: 59

## 2022-04-25 DIAGNOSIS — R072 Precordial pain: Secondary | ICD-10-CM | POA: Diagnosis present

## 2022-04-25 HISTORY — DX: Hyperlipidemia, unspecified: E78.5

## 2022-04-25 LAB — BASIC METABOLIC PANEL
Anion gap: 5 (ref 5–15)
BUN: 17 mg/dL (ref 6–20)
CO2: 25 mmol/L (ref 22–32)
Calcium: 8.8 mg/dL — ABNORMAL LOW (ref 8.9–10.3)
Chloride: 107 mmol/L (ref 98–111)
Creatinine, Ser: 1.33 mg/dL — ABNORMAL HIGH (ref 0.61–1.24)
GFR, Estimated: >60 mL/min (ref >60)
Glucose, Bld: 145 mg/dL — ABNORMAL HIGH (ref 70–99)
Potassium: 3.8 mmol/L (ref 3.5–5.1)
Sodium: 137 mmol/L (ref 135–145)

## 2022-04-25 LAB — CBC
HCT: 38.7 % — ABNORMAL LOW (ref 39.0–52.0)
Hemoglobin: 13.4 g/dL (ref 13.0–17.0)
MCH: 29.6 pg (ref 26.0–34.0)
MCHC: 34.6 g/dL (ref 30.0–36.0)
MCV: 85.6 fL (ref 80.0–100.0)
Platelets: 64 10*3/uL — ABNORMAL LOW (ref 150–400)
RBC: 4.52 MIL/uL (ref 4.22–5.81)
RDW: 15.3 % (ref 11.5–15.5)
WBC: 3.8 10*3/uL — ABNORMAL LOW (ref 4.0–10.5)
nRBC: 0 % (ref 0.0–0.2)

## 2022-04-25 LAB — TROPONIN I (HIGH SENSITIVITY): Troponin I (High Sensitivity): 4 ng/L (ref <18)

## 2022-04-25 LAB — PROTIME-INR
INR: 1 (ref 0.8–1.2)
Prothrombin Time: 13 seconds (ref 11.4–15.2)

## 2022-04-25 NOTE — Discharge Instructions (Signed)
:   Make an appointment follow-up with cardiology.  Also follow-up with your GI doctor it is possible that Crohn's can affect the esophagus.  Could be responsible for the pain since Sunday.  No evidence of an acute cardiac event today.  But always could be a warning sign.  Platelets here today are in the 60,000 range so essentially unchanged.  Return for any new or worse symptoms.

## 2022-04-25 NOTE — ED Notes (Signed)
Pt verbalizes understanding of discharge instructions. Opportunity for questioning and answers were provided. Pt discharged from ED to home with family.    

## 2022-04-25 NOTE — ED Provider Notes (Signed)
[facility] EMERGENCY DEPARTMENT Provider Note   CSN: [id number] Arrival date & time: 04/25/22  2048     History  Chief Complaint  Patient presents with   Chest Pain    Hunter Mcmahon is a 51 y.o. male.  Patient with a complaint of a substernal chest pain waxes and wanes but never been gone since Sunday.  Also had a little bit of discomfort early on when he would drink liquids.  More in the upper part of the chest.  No shortness of breath no nausea vomiting.  Past medical history is significant for history of Crohn's.  Followed by GI medicine.  Has follow-up with them this week.   Also known to have hyperlipidemia.  And patient is an every day smoker half a pack a day.  The difficulty swallowing has resolved.  Never had any obstruction of food or water going down.      Home Medications Prior to Admission medications   Medication Sig Start Date End Date Taking? Authorizing Provider  clindamycin (CLEOCIN) 300 MG capsule Take 1 capsule (300 mg total) by mouth 4 (four) times daily. X 7 days 05/02/15   Loren Racer, MD  inFLIXimab (REMICADE IV) Inject into the vein.    [provider]  naproxen sodium (ANAPROX) 220 MG tablet Take 220 mg by mouth daily as needed (pain). For pain    [provider]  oxyCODONE-acetaminophen (PERCOCET) 5-325 MG per tablet Take 1-2 tablets by mouth every 4 (four) hours as needed for severe pain. 05/02/15   Loren Racer, MD      Allergies    Aspirin    Review of Systems   Review of Systems  Constitutional:  Negative for chills and fever.  HENT:  Negative for ear pain and sore throat.   Eyes:  Negative for pain and visual disturbance.  Respiratory:  Negative for cough and shortness of breath.   Cardiovascular:  Positive for chest pain. Negative for palpitations.  Gastrointestinal:  Negative for abdominal pain and vomiting.  Genitourinary:  Negative for dysuria and hematuria.  Musculoskeletal:  Negative for  arthralgias and back pain.  Skin:  Negative for color change and rash.  Neurological:  Negative for seizures and syncope.  All other systems reviewed and are negative.  Physical Exam Updated Vital Signs BP 112/66   Pulse 87   Temp 98.2 F (36.8 C) (Oral)   Resp 20   Ht 1.93 m (6\' 4" )   Wt 97.1 kg   SpO2 100%   BMI 26.05 kg/m  Physical Exam Vitals and nursing note reviewed.  Constitutional:      General: He is not in acute distress.    Appearance: Normal appearance. He is well-developed.  HENT:     Head: Normocephalic and atraumatic.  Eyes:     Extraocular Movements: Extraocular movements intact.     Conjunctiva/sclera: Conjunctivae normal.     Pupils: Pupils are equal, round, and reactive to light.  Cardiovascular:     Rate and Rhythm: Normal rate and regular rhythm.     Heart sounds: No murmur heard. Pulmonary:     Effort: Pulmonary effort is normal. No respiratory distress.     Breath sounds: Normal breath sounds.  Chest:     Chest wall: No tenderness.  Abdominal:     General: There is no distension.     Palpations: Abdomen is soft.     Tenderness: There is no abdominal tenderness. There is no guarding.  Musculoskeletal:  General: No swelling.     Cervical back: Normal range of motion and neck supple.  Skin:    General: Skin is warm and dry.     Capillary Refill: Capillary refill takes less than 2 seconds.  Neurological:     General: No focal deficit present.     Mental Status: He is alert and oriented to person, place, and time.     Cranial Nerves: No cranial nerve deficit.     Sensory: No sensory deficit.     Motor: No weakness.  Psychiatric:        Mood and Affect: Mood normal.    ED Results / Procedures / Treatments   Labs (all labs ordered are listed, but only abnormal results are displayed) Labs Reviewed  BASIC METABOLIC PANEL - Abnormal; Notable for the following components:      Result Value   Glucose, Bld 145 (*)    Creatinine, Ser 1.33  (*)    Calcium 8.8 (*)    All other components within normal limits  CBC - Abnormal; Notable for the following components:   WBC 3.8 (*)    HCT 38.7 (*)    Platelets 64 (*)    All other components within normal limits  PROTIME-INR  TROPONIN I (HIGH SENSITIVITY)  TROPONIN I (HIGH SENSITIVITY)    EKG EKG Interpretation  Date/Time:  Tuesday Apr 25 2022 20:57:04 EDT Ventricular Rate:  94 PR Interval:  156 QRS Duration: 84 QT Interval:  340 QTC Calculation: 425 R Axis:   52 Text Interpretation: Normal sinus rhythm with sinus arrhythmia Normal ECG When compared with ECG of 02-May-2015 02:48, PREVIOUS ECG IS PRESENT Confirmed by Vanetta MuldersZackowski, Beldon Nowling [phone]([id number]) on 04/25/2022 10:19:10 PM  Radiology DG Chest 2 View  Result Date: 04/25/2022 CLINICAL DATA:  Chest pain EXAM: CHEST - 2 VIEW COMPARISON:  06/24/2019 FINDINGS: Bullous changes at the apices. Lungs are otherwise clear. No pleural effusion or pneumothorax. The heart is normal in size. Visualized osseous structures are within normal limits. IMPRESSION: Normal chest radiographs. Electronically Signed   By: Charline BillsSriyesh  Krishnan M.D.   On: 04/25/2022 21:34    Procedures Procedures    Medications Ordered in ED Medications - No data to display  ED Course/ Medical Decision Making/ A&P                           Medical Decision Making Amount and/or Complexity of Data Reviewed Labs: ordered. Radiology: ordered.   Work-up for the chest pain which has been constant since Sunday.  Initial troponin of 4 very reassuring.  No worsening symptoms this evening do not feel that we need a delta troponin based on that.  Patient has a history of Crohn's disease basic metabolic panel without significant abnormalities creatinine is 1.33 but renal functions normal.  Blood sugar 145.  No leukocytosis white blood cell count 3.8.  Hemoglobin good at 13.4.  Patient known to have thrombocytopenia platelets are 64,000 which is baseline for him.  INR is normal.   Chest x-ray without any acute findings.  EKG without any acute findings.  Due to the duration of the pain I think patient stable for discharge home.  We will have him follow-up with cardiology.  Has follow-up with GI medicine this week.  Upper endoscopy may be indicated at some point if it persists since he does have a history of Crohn's disease.   Final Clinical Impression(s) / ED Diagnoses Final diagnoses:  Precordial pain  Rx / DC Orders ED Discharge Orders          Ordered    Ambulatory referral to Cardiology       Comments: If you have not heard from the Cardiology office within the next 72 hours please call [phone].   04/25/22 2256              Vanetta Mulders, MD 04/25/22 2300

## 2022-04-25 NOTE — ED Triage Notes (Signed)
Patient reports he has been having chest pain since Sunday, midsternal. Patient states when he drinks something he has increased pain. Reports some shortness of breath, denies n/v.

## 2022-06-07 ENCOUNTER — Encounter: Payer: Self-pay | Admitting: *Deleted

## 2022-07-14 IMAGING — DX DG CHEST 2V
2 series · 2 of 2 positions shown · non-contrast
Comparison: 06/24/2019

CLINICAL DATA: Chest pain

EXAM:
CHEST - 2 VIEW

[chest pa]
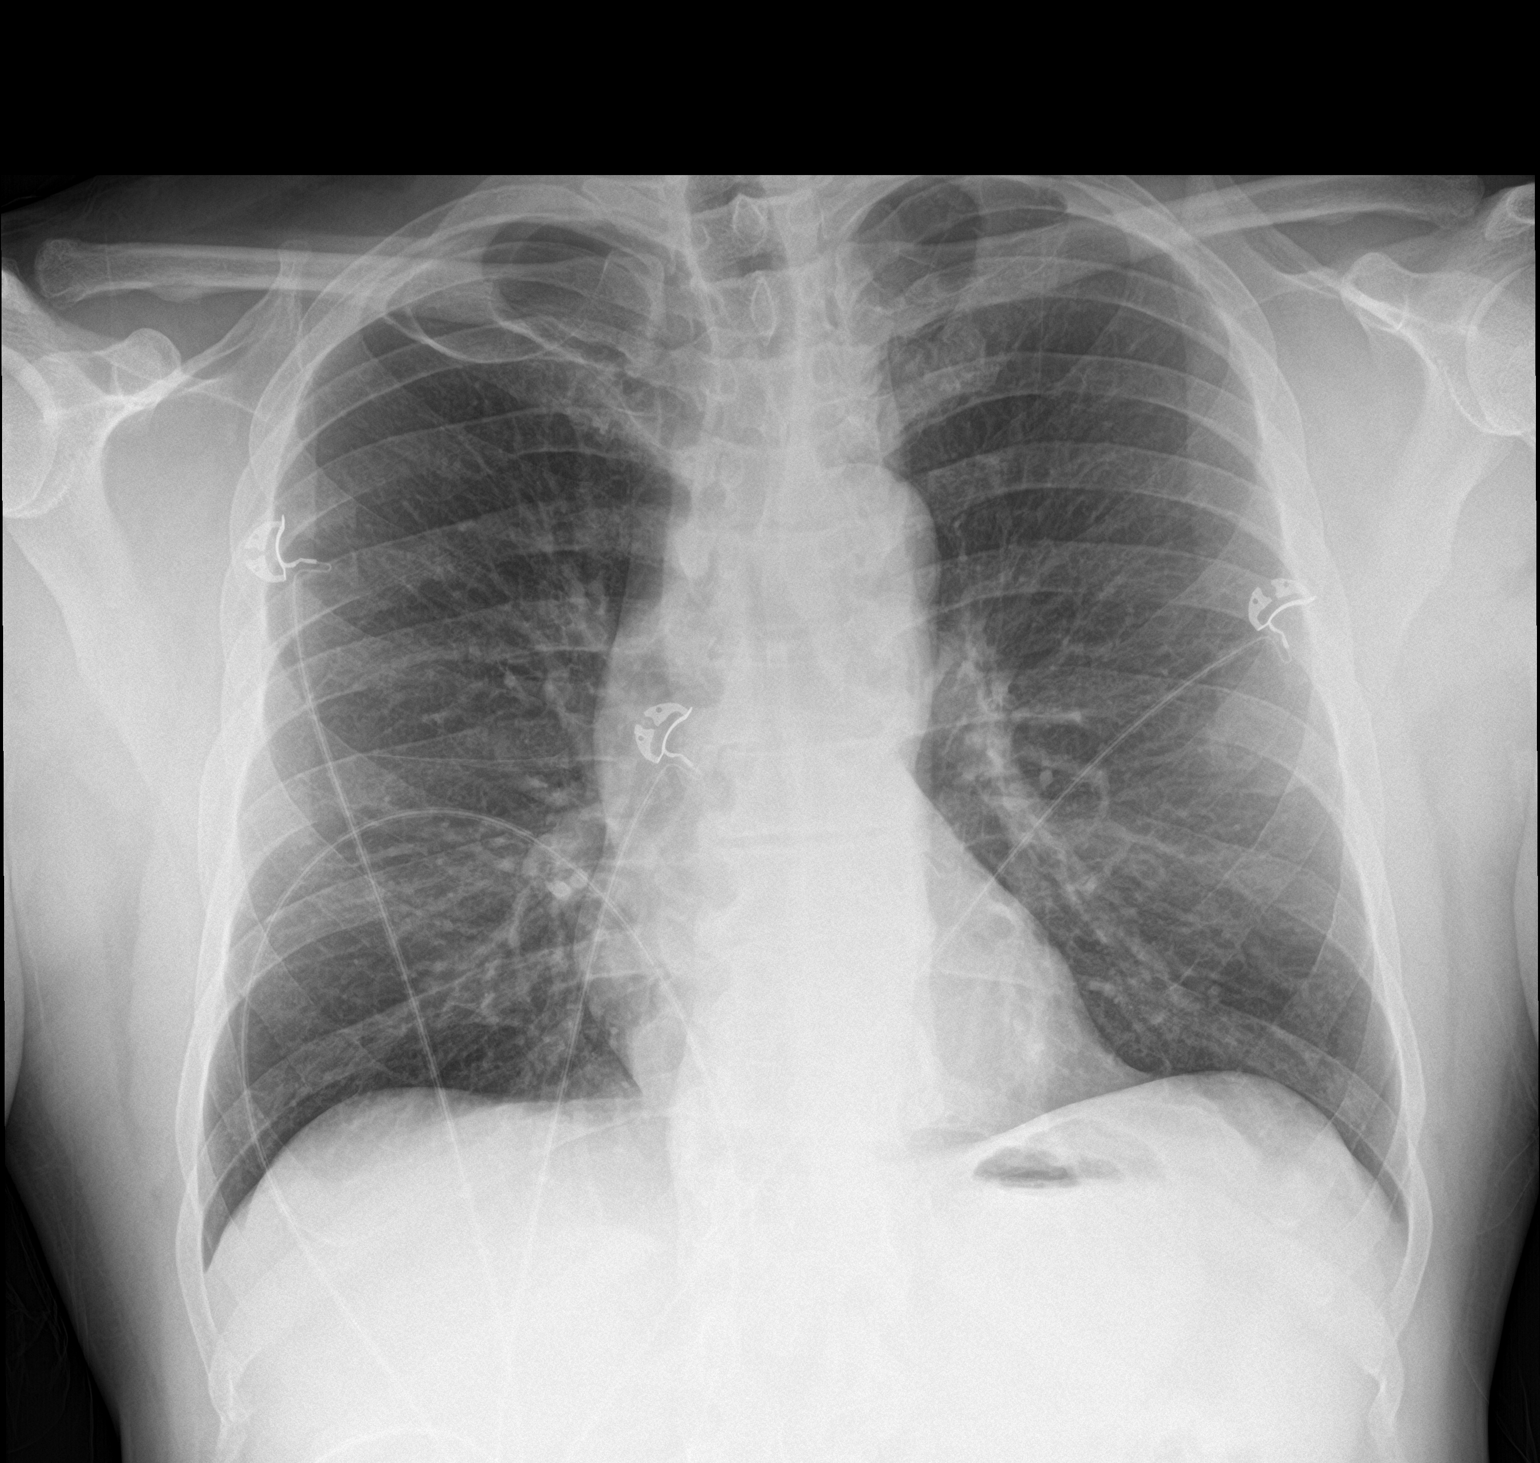

[chest lat]
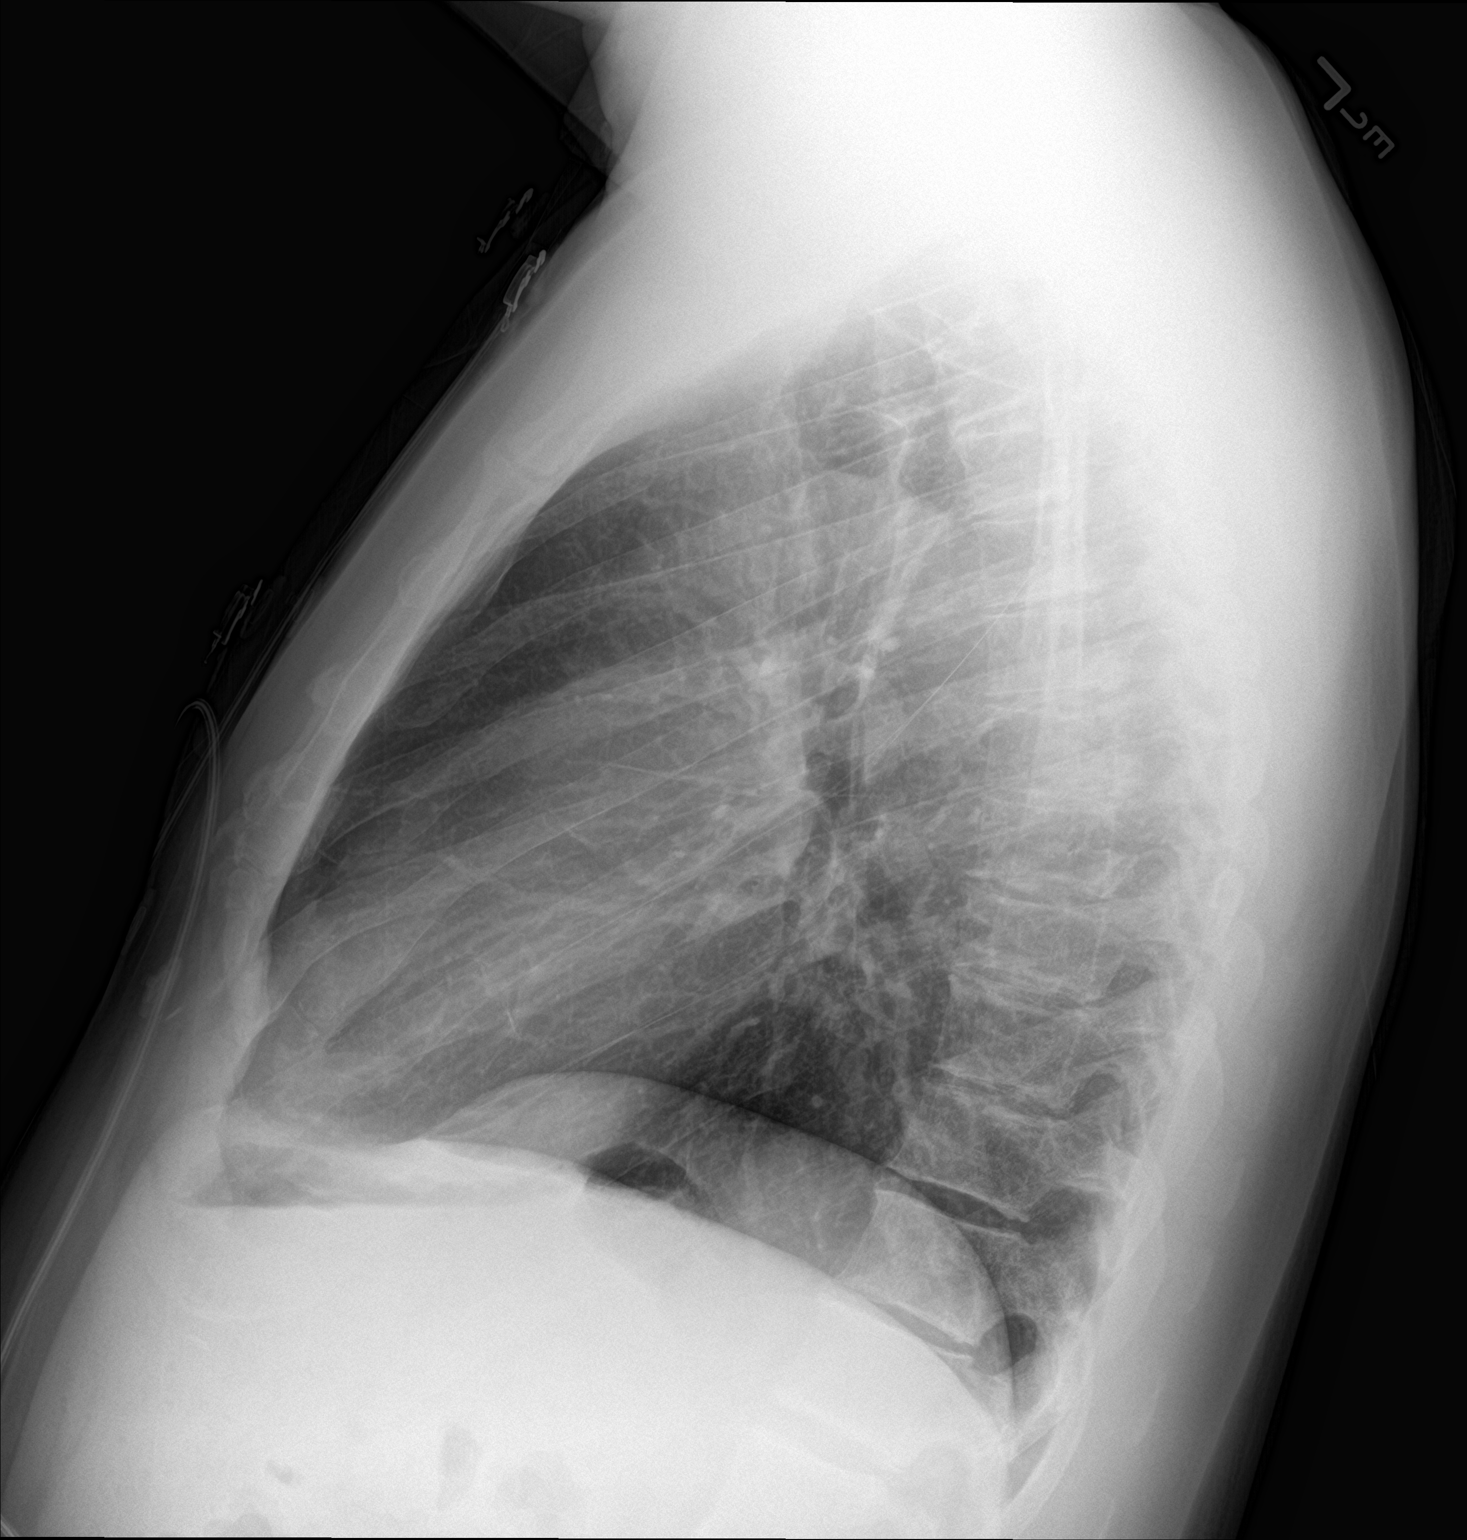

[2 of 2 positions shown; findings below may reference images not displayed]

FINDINGS: Bullous changes at the apices. Lungs are otherwise clear. No pleural
effusion or pneumothorax.

The heart is normal in size.

Visualized osseous structures are within normal limits.
IMPRESSION: Normal chest radiographs.

## 2025-01-01 ENCOUNTER — Other Ambulatory Visit: Payer: Self-pay

## 2025-01-01 ENCOUNTER — Emergency Department (HOSPITAL_BASED_OUTPATIENT_CLINIC_OR_DEPARTMENT_OTHER)
Admission: EM | Admit: 2025-01-01 | Discharge: 2025-01-02 | Disposition: A | Discharge status: Home / Self Care | Attending: Emergency Medicine | Admitting: Emergency Medicine

## 2025-01-01 ENCOUNTER — Emergency Department (HOSPITAL_BASED_OUTPATIENT_CLINIC_OR_DEPARTMENT_OTHER)

## 2025-01-01 DIAGNOSIS — M7989 Other specified soft tissue disorders: Secondary | ICD-10-CM | POA: Diagnosis present

## 2025-01-01 DIAGNOSIS — E109 Type 1 diabetes mellitus without complications: Secondary | ICD-10-CM | POA: Insufficient documentation

## 2025-01-01 DIAGNOSIS — L03115 Cellulitis of right lower limb: Secondary | ICD-10-CM | POA: Diagnosis not present

## 2025-01-01 LAB — COMPREHENSIVE METABOLIC PANEL WITH GFR
ALT: 21 U/L (ref 0–44)
AST: 24 U/L (ref 15–41)
Albumin: 4.4 g/dL (ref 3.5–5.0)
Alkaline Phosphatase: 71 U/L (ref 38–126)
Anion gap: 11 (ref 5–15)
BUN: 17 mg/dL (ref 6–20)
CO2: 23 mmol/L (ref 22–32)
Calcium: 9.1 mg/dL (ref 8.9–10.3)
Chloride: 102 mmol/L (ref 98–111)
Creatinine, Ser: 1 mg/dL (ref 0.61–1.24)
GFR, Estimated: >60 mL/min
Glucose, Bld: 183 mg/dL — ABNORMAL HIGH (ref 70–99)
Potassium: 3.9 mmol/L (ref 3.5–5.1)
Sodium: 137 mmol/L (ref 135–145)
Total Bilirubin: 0.4 mg/dL (ref 0.0–1.2)
Total Protein: 8 g/dL (ref 6.5–8.1)

## 2025-01-01 LAB — CBC WITH DIFFERENTIAL/PLATELET
Abs Immature Granulocytes: 0.09 10*3/uL — ABNORMAL HIGH (ref 0.00–0.07)
Basophils Absolute: 0.1 10*3/uL (ref 0.0–0.1)
Basophils Relative: 1 %
Eosinophils Absolute: 0 10*3/uL (ref 0.0–0.5)
Eosinophils Relative: 0 %
HCT: 35.6 % — ABNORMAL LOW (ref 39.0–52.0)
Hemoglobin: 12.6 g/dL — ABNORMAL LOW (ref 13.0–17.0)
Immature Granulocytes: 1 %
Lymphocytes Relative: 24 %
Lymphs Abs: 1.6 10*3/uL (ref 0.7–4.0)
MCH: 31.2 pg (ref 26.0–34.0)
MCHC: 35.4 g/dL (ref 30.0–36.0)
MCV: 88.1 fL (ref 80.0–100.0)
Monocytes Absolute: 3.3 10*3/uL — ABNORMAL HIGH (ref 0.1–1.0)
Monocytes Relative: 50 %
Neutro Abs: 1.5 10*3/uL — ABNORMAL LOW (ref 1.7–7.7)
Neutrophils Relative %: 24 %
Platelets: 79 10*3/uL — ABNORMAL LOW (ref 150–400)
RBC: 4.04 MIL/uL — ABNORMAL LOW (ref 4.22–5.81)
RDW: 16.4 % — ABNORMAL HIGH (ref 11.5–15.5)
WBC: 6.5 10*3/uL (ref 4.0–10.5)
nRBC: 0 % (ref 0.0–0.2)

## 2025-01-01 LAB — LACTIC ACID, PLASMA: Lactic Acid, Venous: 0.8 mmol/L (ref 0.5–1.9)

## 2025-01-01 MED ORDER — SODIUM CHLORIDE 0.9 % IV SOLN
3.0000 g | Freq: Once | INTRAVENOUS | Status: AC
  Administered 2025-01-02: 3 g via INTRAVENOUS

## 2025-01-01 NOTE — ED Triage Notes (Signed)
 Pt c/o RLE swelling, from mid calf into foot, intermittent, x 3 weeks. Worse today. Denies known injury.   Also reports new cyst to R middle toe x 1 week. Tender to touch.

## 2025-01-01 NOTE — ED Provider Notes (Signed)
 " [place] [facility] AT [facility] Provider Note   CSN: [id number] Arrival date & time: 01/01/25  2158     Patient presents with: Leg Pain   Hunter Mcmahon is a 54 y.o. male.   Patient is a 54 year old male with history of type 1 diabetes, Crohn's disease.  Patient presenting today with complaints of right leg swelling and discomfort.  This has been worsening over the past few days.  He denies any injury or trauma.  He had a similar episode in July and was diagnosed with cellulitis.  He denies any fevers or chills.       Prior to Admission medications  Medication Sig Start Date End Date Taking? Authorizing Provider  clindamycin  (CLEOCIN ) 300 MG capsule Take 1 capsule (300 mg total) by mouth 4 (four) times daily. X 7 days 05/02/15   Carlyle Lenis, MD  inFLIXimab (REMICADE IV) Inject into the vein.    [provider]  naproxen  sodium (ANAPROX ) 220 MG tablet Take 220 mg by mouth daily as needed (pain). For pain    [provider]  oxyCODONE -acetaminophen  (PERCOCET) 5-325 MG per tablet Take 1-2 tablets by mouth every 4 (four) hours as needed for severe pain. 05/02/15   Carlyle Lenis, MD    Allergies: Aspirin    Review of Systems  All other systems reviewed and are negative.   Updated Vital Signs BP (!) 116/92   Pulse (!) 109   Temp 99.5 F (37.5 C)   Resp 17   Ht 6' 4 (1.93 m)   Wt 95.7 kg   SpO2 98%   BMI 25.68 kg/m   Physical Exam Vitals and nursing note reviewed.  HENT:     Head: Normocephalic.  Pulmonary:     Effort: Pulmonary effort is normal.  Musculoskeletal:     Comments: The right lower extremity does have swelling noted just above the ankle and extending into the medial foot.  There is some redness and swelling and drainage coming from the third toe as well.  DP pulses are palpable.  There is no calf tenderness and Homans' sign is absent.  Skin:    General: Skin is warm and dry.  Neurological:     Mental  Status: He is alert and oriented to person, place, and time.     (all labs ordered are listed, but only abnormal results are displayed) Labs Reviewed  COMPREHENSIVE METABOLIC PANEL WITH GFR - Abnormal; Notable for the following components:      Result Value   Glucose, Bld 183 (*)    All other components within normal limits  CBC WITH DIFFERENTIAL/PLATELET - Abnormal; Notable for the following components:   RBC 4.04 (*)    Hemoglobin 12.6 (*)    HCT 35.6 (*)    RDW 16.4 (*)    Platelets 79 (*)    Neutro Abs 1.5 (*)    Monocytes Absolute 3.3 (*)    Abs Immature Granulocytes 0.09 (*)    All other components within normal limits  LACTIC ACID, PLASMA  URINALYSIS, W/ REFLEX TO CULTURE (INFECTION SUSPECTED)    EKG: None  Radiology: DG Tibia/Fibula Right Result Date: 01/01/2025 EXAM: VIEW(S) XRAY OF THE RIGHT TIBIA AND FIBULA 01/01/2025 10:21:00 PM COMPARISON: None available. CLINICAL HISTORY: Swelling. FINDINGS: BONES AND JOINTS: No acute fracture. No malalignment. SOFT TISSUES: Soft tissue swelling along medial ankle. IMPRESSION: 1. Soft tissue swelling along the medial ankle. Electronically signed by: Elsie Gravely MD 01/01/2025 10:24 PM EST RP Workstation: [id number]  Procedures   Medications Ordered in the ED  Ampicillin -Sulbactam (UNASYN ) 3 g in sodium chloride  0.9 % 100 mL IVPB (has no administration in time range)                                    Medical Decision Making Amount and/or Complexity of Data Reviewed Labs: ordered. Radiology: ordered.  Risk Prescription drug management.   Patient presenting with swelling and discomfort to his right lower leg.  This has been worsening over the past several days.  Patient arrives here with stable vital signs and is afebrile.  He does have warmth, erythema, and tenderness to the distal right leg consistent with cellulitis.  Laboratory studies obtained including CBC and basic metabolic panel.  Both are basically  unremarkable.  There is no leukocytosis.  X-rays of the tib-fib showed soft tissue swelling, but no other obvious abnormality.  Patient has been given IV Unasyn  and will be discharged with Augmentin .  Symptoms appear to be cellulitis.  I have considered DVT, but highly doubt this possibility.     Final diagnoses:  None    ED Discharge Orders     None          Geroldine Berg, MD 01/02/25 9896  "

## 2025-01-02 ENCOUNTER — Other Ambulatory Visit (HOSPITAL_BASED_OUTPATIENT_CLINIC_OR_DEPARTMENT_OTHER): Payer: Self-pay

## 2025-01-02 MED ORDER — HYDROCODONE-ACETAMINOPHEN 5-325 MG PO TABS: 1.0000 | ORAL_TABLET | Freq: Four times a day (QID) | ORAL | 0 refills | Status: AC | PRN

## 2025-01-02 MED ORDER — HYDROCODONE-ACETAMINOPHEN 5-325 MG PO TABS
2.0000 | ORAL_TABLET | Freq: Once | ORAL | Status: AC
  Administered 2025-01-02: 2 via ORAL
  Filled 2025-01-02: qty 2

## 2025-01-02 MED ORDER — AMOXICILLIN-POT CLAVULANATE 500-125 MG PO TABS: 1.0000 | ORAL_TABLET | Freq: Three times a day (TID) | ORAL | 0 refills | Status: AC

## 2025-01-02 NOTE — Discharge Instructions (Addendum)
 Begin taking Augmentin  as prescribed.  Take hydrocodone  as prescribed as needed for pain.  Follow-up with primary doctor if not improving in the next few days, and return to the ER if you develop high fevers, worsening pain, worsening swelling, or for other new and concerning symptoms.
